# Patient Record
Sex: Female | Born: 1950 | Race: White | Hispanic: No | Smoking: Never smoker
Health system: Southern US, Community
[De-identification: ages and names within clinical notes are randomized; demographics above are authoritative.]

## PROBLEM LIST (undated history)

## (undated) DIAGNOSIS — I10 Essential (primary) hypertension: Secondary | ICD-10-CM

## (undated) DIAGNOSIS — F32A Depression, unspecified: Secondary | ICD-10-CM

## (undated) DIAGNOSIS — F329 Major depressive disorder, single episode, unspecified: Secondary | ICD-10-CM

## (undated) DIAGNOSIS — Z98811 Dental restoration status: Secondary | ICD-10-CM

## (undated) DIAGNOSIS — N3281 Overactive bladder: Secondary | ICD-10-CM

## (undated) DIAGNOSIS — S83249A Other tear of medial meniscus, current injury, unspecified knee, initial encounter: Secondary | ICD-10-CM

## (undated) HISTORY — PX: ABDOMINAL HYSTERECTOMY: SHX81

## (undated) HISTORY — PX: LAPAROSCOPIC CHOLECYSTECTOMY: SUR755

## (undated) HISTORY — PX: TUBAL LIGATION: SHX77

## (undated) HISTORY — PX: CARPAL TUNNEL RELEASE: SHX101

## (undated) HISTORY — PX: BREAST REDUCTION SURGERY: SHX8

## (undated) HISTORY — DX: Major depressive disorder, single episode, unspecified: F32.9

## (undated) HISTORY — DX: Depression, unspecified: F32.A

---

## 2011-10-06 ENCOUNTER — Other Ambulatory Visit (HOSPITAL_COMMUNITY): Payer: Self-pay | Admitting: Orthopedic Surgery

## 2011-10-06 DIAGNOSIS — S93629A Sprain of tarsometatarsal ligament of unspecified foot, initial encounter: Secondary | ICD-10-CM

## 2011-10-09 ENCOUNTER — Other Ambulatory Visit (HOSPITAL_COMMUNITY): Payer: Self-pay | Admitting: Orthopedic Surgery

## 2011-10-09 DIAGNOSIS — T148XXA Other injury of unspecified body region, initial encounter: Secondary | ICD-10-CM

## 2011-10-12 ENCOUNTER — Inpatient Hospital Stay (HOSPITAL_COMMUNITY): Admission: RE | Admit: 2011-10-12 | Payer: Self-pay | Source: Ambulatory Visit

## 2011-10-19 ENCOUNTER — Ambulatory Visit (HOSPITAL_COMMUNITY)
Admission: RE | Admit: 2011-10-19 | Discharge: 2011-10-19 | Disposition: A | Discharge status: Home / Self Care | Payer: 59 | Source: Ambulatory Visit | Attending: Orthopedic Surgery | Admitting: Orthopedic Surgery

## 2011-10-19 DIAGNOSIS — S93629A Sprain of tarsometatarsal ligament of unspecified foot, initial encounter: Secondary | ICD-10-CM

## 2011-10-19 DIAGNOSIS — M7730 Calcaneal spur, unspecified foot: Secondary | ICD-10-CM | POA: Insufficient documentation

## 2011-10-19 DIAGNOSIS — R609 Edema, unspecified: Secondary | ICD-10-CM | POA: Insufficient documentation

## 2011-10-19 DIAGNOSIS — T148XXA Other injury of unspecified body region, initial encounter: Secondary | ICD-10-CM

## 2011-10-19 DIAGNOSIS — M25579 Pain in unspecified ankle and joints of unspecified foot: Secondary | ICD-10-CM | POA: Insufficient documentation

## 2011-10-19 DIAGNOSIS — M76829 Posterior tibial tendinitis, unspecified leg: Secondary | ICD-10-CM | POA: Insufficient documentation

## 2012-07-04 ENCOUNTER — Other Ambulatory Visit (HOSPITAL_COMMUNITY): Payer: Self-pay | Admitting: Family Medicine

## 2012-07-04 DIAGNOSIS — M25561 Pain in right knee: Secondary | ICD-10-CM

## 2012-07-07 ENCOUNTER — Ambulatory Visit (HOSPITAL_COMMUNITY)
Admission: RE | Admit: 2012-07-07 | Discharge: 2012-07-07 | Disposition: A | Discharge status: Home / Self Care | Payer: 59 | Source: Ambulatory Visit | Attending: Family Medicine | Admitting: Family Medicine

## 2012-07-07 ENCOUNTER — Ambulatory Visit (HOSPITAL_COMMUNITY): Admission: RE | Admit: 2012-07-07 | Payer: 59 | Source: Ambulatory Visit

## 2012-07-07 DIAGNOSIS — M7120 Synovial cyst of popliteal space [Baker], unspecified knee: Secondary | ICD-10-CM | POA: Insufficient documentation

## 2012-07-07 DIAGNOSIS — X58XXXA Exposure to other specified factors, initial encounter: Secondary | ICD-10-CM | POA: Insufficient documentation

## 2012-07-07 DIAGNOSIS — M899 Disorder of bone, unspecified: Secondary | ICD-10-CM | POA: Insufficient documentation

## 2012-07-07 DIAGNOSIS — M25561 Pain in right knee: Secondary | ICD-10-CM

## 2012-07-07 DIAGNOSIS — IMO0002 Reserved for concepts with insufficient information to code with codable children: Secondary | ICD-10-CM | POA: Insufficient documentation

## 2012-07-07 DIAGNOSIS — M25469 Effusion, unspecified knee: Secondary | ICD-10-CM | POA: Insufficient documentation

## 2012-07-19 ENCOUNTER — Other Ambulatory Visit: Payer: Self-pay | Admitting: Orthopaedic Surgery

## 2012-07-23 DIAGNOSIS — S83249A Other tear of medial meniscus, current injury, unspecified knee, initial encounter: Secondary | ICD-10-CM

## 2012-07-23 HISTORY — DX: Other tear of medial meniscus, current injury, unspecified knee, initial encounter: S83.249A

## 2012-07-26 ENCOUNTER — Encounter (HOSPITAL_BASED_OUTPATIENT_CLINIC_OR_DEPARTMENT_OTHER): Payer: Self-pay | Admitting: *Deleted

## 2012-07-26 NOTE — Pre-Procedure Instructions (Signed)
To come for BMET and EKG 

## 2012-07-27 NOTE — H&P (Signed)
Alejandra Hartman is an 62 y.o. female.   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  She is having increasing right knee pain that is very intense.  It has been present for 3 months.  She has tried Celebrex to calm it down.  Her activities and sleep are both limited by knee pain.  She feels that things are getting worse.  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  We have discussed proceeding with a knee arthroscopy to help limit her pain and improve her function.  Past Medical History  Diagnosis Date  . Overactive bladder   . Medial meniscus tear 07/2012    right knee  . Dental crowns present   . Hypertension     under control with med., has been on med. x 10 yr.    Past Surgical History  Procedure Date  . Laparoscopic cholecystectomy   . Carpal tunnel release     bilateral  . Abdominal hysterectomy     complete  . Breast reduction surgery   . Tubal ligation     History reviewed. No pertinent family history. Social History:  reports that she has never smoked. She has never used smokeless tobacco. She reports that she drinks alcohol. She reports that she does not use illicit drugs.  Allergies: No Known Allergies  No prescriptions prior to admission    No results found for this or any previous visit (from the past 48 hour(s)). No results found.  Review of Systems  Constitutional: Negative.   HENT: Negative.   Eyes: Negative.   Respiratory: Negative.   Cardiovascular: Negative.   Gastrointestinal: Negative.   Genitourinary: Negative.   Musculoskeletal: Positive for joint pain.  Skin: Negative.   Neurological: Negative.   Endo/Heme/Allergies: Negative.   Psychiatric/Behavioral: Negative.     Height 5\' 2"  (1.575 m), weight 67.132 kg (148 lb). Physical Exam  Constitutional: She appears well-nourished.  HENT:  Head:  Normocephalic.  Eyes: Pupils are equal, round, and reactive to light.  Neck: Normal range of motion.  Cardiovascular: Normal rate and regular rhythm.   Respiratory: Breath sounds normal.  GI: Bowel sounds are normal.  Musculoskeletal:       Right knee exam: Range of motion 0-1 25.  Trace effusion.  Mild crepitation with motion.  Positive medial McMurray's.  Pain along the medial joint line to direct palpation.  Calf soft and nontender negative Homans.  Neurological: She is alert.  Skin: Skin is warm.  Psychiatric: She has a normal mood and affect.     Assessment/Plan Assessment: Right knee torn medial meniscus Plan: Rylinn would like to proceed with a right knee arthroscopy to take care of the meniscus tear.  We have reviewed the risks of anesthesia, infection and potential DVT related to a knee arthroscopy.  Have also discussed the need for postoperative therapy to maximize results.  Hanni Milford R 07/27/2012, 5:36 PM

## 2012-07-28 ENCOUNTER — Encounter (HOSPITAL_BASED_OUTPATIENT_CLINIC_OR_DEPARTMENT_OTHER)
Admission: RE | Admit: 2012-07-28 | Discharge: 2012-07-28 | Disposition: A | Discharge status: Home / Self Care | Payer: 59 | Source: Ambulatory Visit | Attending: Orthopaedic Surgery | Admitting: Orthopaedic Surgery

## 2012-07-28 LAB — BASIC METABOLIC PANEL
BUN: 19 mg/dL (ref 6–23)
CO2: 24 mEq/L (ref 19–32)
Chloride: 105 mEq/L (ref 96–112)
Creatinine, Ser: 0.85 mg/dL (ref 0.50–1.10)

## 2012-08-02 ENCOUNTER — Encounter (HOSPITAL_BASED_OUTPATIENT_CLINIC_OR_DEPARTMENT_OTHER): Payer: Self-pay | Admitting: Anesthesiology

## 2012-08-02 ENCOUNTER — Ambulatory Visit (HOSPITAL_BASED_OUTPATIENT_CLINIC_OR_DEPARTMENT_OTHER): Payer: 59 | Admitting: Anesthesiology

## 2012-08-02 ENCOUNTER — Encounter (HOSPITAL_BASED_OUTPATIENT_CLINIC_OR_DEPARTMENT_OTHER)
Admission: RE | Disposition: A | Discharge status: Home / Self Care | Payer: Self-pay | Source: Ambulatory Visit | Attending: Orthopaedic Surgery

## 2012-08-02 ENCOUNTER — Ambulatory Visit (HOSPITAL_BASED_OUTPATIENT_CLINIC_OR_DEPARTMENT_OTHER)
Admission: RE | Admit: 2012-08-02 | Discharge: 2012-08-02 | Disposition: A | Discharge status: Home / Self Care | Payer: 59 | Source: Ambulatory Visit | Attending: Orthopaedic Surgery | Admitting: Orthopaedic Surgery

## 2012-08-02 DIAGNOSIS — M1710 Unilateral primary osteoarthritis, unspecified knee: Secondary | ICD-10-CM | POA: Insufficient documentation

## 2012-08-02 DIAGNOSIS — M23329 Other meniscus derangements, posterior horn of medial meniscus, unspecified knee: Secondary | ICD-10-CM | POA: Insufficient documentation

## 2012-08-02 DIAGNOSIS — I10 Essential (primary) hypertension: Secondary | ICD-10-CM | POA: Insufficient documentation

## 2012-08-02 DIAGNOSIS — M2240 Chondromalacia patellae, unspecified knee: Secondary | ICD-10-CM | POA: Insufficient documentation

## 2012-08-02 DIAGNOSIS — Z01812 Encounter for preprocedural laboratory examination: Secondary | ICD-10-CM | POA: Insufficient documentation

## 2012-08-02 DIAGNOSIS — S83249A Other tear of medial meniscus, current injury, unspecified knee, initial encounter: Secondary | ICD-10-CM

## 2012-08-02 HISTORY — DX: Dental restoration status: Z98.811

## 2012-08-02 HISTORY — DX: Other tear of medial meniscus, current injury, unspecified knee, initial encounter: S83.249A

## 2012-08-02 HISTORY — DX: Essential (primary) hypertension: I10

## 2012-08-02 HISTORY — PX: KNEE ARTHROSCOPY: SHX127

## 2012-08-02 HISTORY — DX: Overactive bladder: N32.81

## 2012-08-02 SURGERY — ARTHROSCOPY, KNEE
Anesthesia: General | Site: Knee | Laterality: Right | Wound class: Clean

## 2012-08-02 MED ORDER — LIDOCAINE HCL (CARDIAC) 20 MG/ML IV SOLN
INTRAVENOUS | Status: DC | PRN
  Administered 2012-08-02: 50 mg via INTRAVENOUS

## 2012-08-02 MED ORDER — MIDAZOLAM HCL 2 MG/ML PO SYRP: 12.0000 mg | ORAL_SOLUTION | Freq: Once | ORAL | Status: DC | PRN

## 2012-08-02 MED ORDER — LACTATED RINGERS IV SOLN
INTRAVENOUS | Status: DC
Start: 2012-08-02 — End: 2012-08-02
  Administered 2012-08-02: 08:00:00 via INTRAVENOUS

## 2012-08-02 MED ORDER — BUPIVACAINE HCL (PF) 0.25 % IJ SOLN
INTRAMUSCULAR | Status: DC | PRN
  Administered 2012-08-02: 4 mL
  Administered 2012-08-02: 19 mL

## 2012-08-02 MED ORDER — DEXAMETHASONE SODIUM PHOSPHATE 4 MG/ML IJ SOLN
INTRAMUSCULAR | Status: DC | PRN
  Administered 2012-08-02: 10 mg via INTRAVENOUS

## 2012-08-02 MED ORDER — HYDROCODONE-ACETAMINOPHEN 5-325 MG PO TABS: 1.0000 | ORAL_TABLET | Freq: Four times a day (QID) | ORAL | Status: DC | PRN

## 2012-08-02 MED ORDER — FENTANYL CITRATE 0.05 MG/ML IJ SOLN: 50.0000 ug | INTRAMUSCULAR | Status: DC | PRN

## 2012-08-02 MED ORDER — FENTANYL CITRATE 0.05 MG/ML IJ SOLN
INTRAMUSCULAR | Status: DC | PRN
  Administered 2012-08-02: 100 ug via INTRAVENOUS

## 2012-08-02 MED ORDER — MIDAZOLAM HCL 2 MG/2ML IJ SOLN: 1.0000 mg | INTRAMUSCULAR | Status: DC | PRN

## 2012-08-02 MED ORDER — MORPHINE SULFATE 4 MG/ML IJ SOLN
INTRAMUSCULAR | Status: DC | PRN
  Administered 2012-08-02: 4 mg via INTRAVENOUS

## 2012-08-02 MED ORDER — CEFAZOLIN SODIUM-DEXTROSE 2-3 GM-% IV SOLR
2.0000 g | INTRAVENOUS | Status: AC
  Administered 2012-08-02: 2 g via INTRAVENOUS

## 2012-08-02 MED ORDER — CHLORHEXIDINE GLUCONATE 4 % EX LIQD
60.0000 mL | Freq: Once | CUTANEOUS | Status: DC
Start: 2012-08-02 — End: 2012-08-02

## 2012-08-02 MED ORDER — PROPOFOL 10 MG/ML IV BOLUS
INTRAVENOUS | Status: DC | PRN
  Administered 2012-08-02: 200 mg via INTRAVENOUS

## 2012-08-02 MED ORDER — SODIUM CHLORIDE 0.9 % IR SOLN
Status: DC | PRN
  Administered 2012-08-02: 6

## 2012-08-02 MED ORDER — LACTATED RINGERS IV SOLN
INTRAVENOUS | Status: DC
  Administered 2012-08-02: 10:00:00 via INTRAVENOUS

## 2012-08-02 MED ORDER — OXYCODONE HCL 5 MG PO TABS
5.0000 mg | ORAL_TABLET | Freq: Once | ORAL | Status: AC | PRN
  Administered 2012-08-02: 5 mg via ORAL

## 2012-08-02 MED ORDER — HYDROMORPHONE HCL PF 1 MG/ML IJ SOLN
0.2500 mg | INTRAMUSCULAR | Status: DC | PRN
  Administered 2012-08-02 (×2): 0.5 mg via INTRAVENOUS

## 2012-08-02 MED ORDER — METHYLPREDNISOLONE ACETATE 40 MG/ML IJ SUSP
INTRAMUSCULAR | Status: DC | PRN
  Administered 2012-08-02 (×2): 40 mg via INTRA_ARTICULAR

## 2012-08-02 MED ORDER — ONDANSETRON HCL 4 MG/2ML IJ SOLN
INTRAMUSCULAR | Status: DC | PRN
  Administered 2012-08-02: 4 mg via INTRAVENOUS

## 2012-08-02 MED ORDER — MIDAZOLAM HCL 5 MG/5ML IJ SOLN
INTRAMUSCULAR | Status: DC | PRN
  Administered 2012-08-02: 2 mg via INTRAVENOUS

## 2012-08-02 MED ORDER — OXYCODONE HCL 5 MG/5ML PO SOLN: 5.0000 mg | Freq: Once | ORAL | Status: AC | PRN

## 2012-08-02 SURGICAL SUPPLY — 40 items
BANDAGE ELASTIC 6 VELCRO ST LF (GAUZE/BANDAGES/DRESSINGS) ×2 IMPLANT
BANDAGE GAUZE ELAST BULKY 4 IN (GAUZE/BANDAGES/DRESSINGS) ×2 IMPLANT
BLADE CUDA 5.5 (BLADE) IMPLANT
BLADE GREAT WHITE 4.2 (BLADE) ×2 IMPLANT
CANISTER OMNI JUG 16 LITER (MISCELLANEOUS) IMPLANT
CANISTER SUCTION 2500CC (MISCELLANEOUS) IMPLANT
DRAPE ARTHROSCOPY W/POUCH 114 (DRAPES) ×2 IMPLANT
DRAPE U-SHAPE 47X51 STRL (DRAPES) ×2 IMPLANT
DRSG EMULSION OIL 3X3 NADH (GAUZE/BANDAGES/DRESSINGS) ×2 IMPLANT
DURAPREP 26ML APPLICATOR (WOUND CARE) ×2 IMPLANT
ELECT MENISCUS 165MM 90D (ELECTRODE) IMPLANT
ELECT REM PT RETURN 9FT ADLT (ELECTROSURGICAL)
ELECTRODE REM PT RTRN 9FT ADLT (ELECTROSURGICAL) IMPLANT
GLOVE BIO SURGEON STRL SZ 6.5 (GLOVE) ×2 IMPLANT
GLOVE BIO SURGEON STRL SZ8.5 (GLOVE) ×2 IMPLANT
GLOVE BIOGEL PI IND STRL 8 (GLOVE) ×1 IMPLANT
GLOVE BIOGEL PI IND STRL 8.5 (GLOVE) ×1 IMPLANT
GLOVE BIOGEL PI INDICATOR 8 (GLOVE) ×1
GLOVE BIOGEL PI INDICATOR 8.5 (GLOVE) ×1
GLOVE ECLIPSE 6.5 STRL STRAW (GLOVE) ×2 IMPLANT
GLOVE INDICATOR 7.0 STRL GRN (GLOVE) ×4 IMPLANT
GLOVE SS BIOGEL STRL SZ 8 (GLOVE) ×1 IMPLANT
GLOVE SUPERSENSE BIOGEL SZ 8 (GLOVE) ×1
GOWN PREVENTION PLUS XLARGE (GOWN DISPOSABLE) ×6 IMPLANT
GOWN PREVENTION PLUS XXLARGE (GOWN DISPOSABLE) ×2 IMPLANT
IV NS IRRIG 3000ML ARTHROMATIC (IV SOLUTION) IMPLANT
KNEE WRAP E Z 3 GEL PACK (MISCELLANEOUS) ×2 IMPLANT
PACK ARTHROSCOPY DSU (CUSTOM PROCEDURE TRAY) ×2 IMPLANT
PACK BASIN DAY SURGERY FS (CUSTOM PROCEDURE TRAY) ×2 IMPLANT
PENCIL BUTTON HOLSTER BLD 10FT (ELECTRODE) IMPLANT
SET ARTHROSCOPY TUBING (MISCELLANEOUS) ×1
SET ARTHROSCOPY TUBING LN (MISCELLANEOUS) ×1 IMPLANT
SHEET MEDIUM DRAPE 40X70 STRL (DRAPES) ×2 IMPLANT
SPONGE GAUZE 4X4 12PLY (GAUZE/BANDAGES/DRESSINGS) ×2 IMPLANT
SYR 3ML 18GX1 1/2 (SYRINGE) IMPLANT
TOWEL OR 17X24 6PK STRL BLUE (TOWEL DISPOSABLE) ×2 IMPLANT
TOWEL OR NON WOVEN STRL DISP B (DISPOSABLE) ×2 IMPLANT
WAND 30 DEG SABER W/CORD (SURGICAL WAND) IMPLANT
WAND STAR VAC 90 (SURGICAL WAND) IMPLANT
WATER STERILE IRR 1000ML POUR (IV SOLUTION) ×2 IMPLANT

## 2012-08-02 NOTE — Interval H&P Note (Signed)
History and Physical Interval Note:  08/02/2012 8:02 AM  Alejandra Hartman  has presented today for surgery, with the diagnosis of right medial meniscal tear  The various methods of treatment have been discussed with the patient and family. After consideration of risks, benefits and other options for treatment, the patient has consented to  Procedure(s): ARTHROSCOPY KNEE (Right) as a surgical intervention .  The patient's history has been reviewed, patient examined, no change in status, stable for surgery.  I have reviewed the patient's chart and labs.  Questions were answered to the patient's satisfaction.     [name]

## 2012-08-02 NOTE — Anesthesia Preprocedure Evaluation (Signed)
Anesthesia Evaluation  Patient identified by MRN, date of birth, ID band Patient awake    Reviewed: Allergy & Precautions, H&P , NPO status , Patient's Chart, lab work & pertinent test results, reviewed documented beta blocker date and time   Airway Mallampati: II TM Distance: >3 FB Neck ROM: Full    Dental no notable dental hx. (+) Teeth Intact and Dental Advisory Given   Pulmonary neg pulmonary ROS,  breath sounds clear to auscultation  Pulmonary exam normal       Cardiovascular hypertension, On Medications and On Home Beta Blockers Rhythm:Regular Rate:Normal     Neuro/Psych negative neurological ROS  negative psych ROS   GI/Hepatic negative GI ROS, Neg liver ROS,   Endo/Other  negative endocrine ROS  Renal/GU negative Renal ROS  negative genitourinary   Musculoskeletal   Abdominal   Peds  Hematology negative hematology ROS (+)   Anesthesia Other Findings   Reproductive/Obstetrics negative OB ROS                           Anesthesia Physical Anesthesia Plan  ASA: II  Anesthesia Plan: General   Post-op Pain Management:    Induction: Intravenous  Airway Management Planned: LMA  Additional Equipment:   Intra-op Plan:   Post-operative Plan: Extubation in OR  Informed Consent: I have reviewed the patients History and Physical, chart, labs and discussed the procedure including the risks, benefits and alternatives for the proposed anesthesia with the patient or authorized representative who has indicated his/her understanding and acceptance.   Dental advisory given  Plan Discussed with: CRNA  Anesthesia Plan Comments:         Anesthesia Quick Evaluation

## 2012-08-02 NOTE — Transfer of Care (Signed)
Immediate Anesthesia Transfer of Care Note  Patient: Alejandra Hartman  Procedure(s) Performed: Procedure(s) with comments: ARTHROSCOPY KNEE (Right) - medial menisectomy and chonroplasty  Patient Location: PACU  Anesthesia Type:General  Level of Consciousness: sedated  Airway & Oxygen Therapy: Patient Spontanous Breathing and Patient connected to face mask oxygen  Post-op Assessment: Report given to PACU RN and Post -op Vital signs reviewed and stable  Post vital signs: Reviewed and stable  Complications: No apparent anesthesia complications

## 2012-08-02 NOTE — Op Note (Signed)
NAMECARMENCITA, Hartman NO.:  [id number]  MEDICAL RECORD NO.:  [id number]  LOCATION:  MRI                          FACILITY:  [facility]  PHYSICIAN:  Lubertha Basque. Kaio Kuhlman, M.D.DATE OF BIRTH:  September 29, 1950  DATE OF PROCEDURE:  08/02/2012 DATE OF DISCHARGE:  07/07/2012                              OPERATIVE REPORT   PREOPERATIVE DIAGNOSES: 1. Right knee torn medial meniscus. 2. Right knee chondromalacia.  POSTOPERATIVE DIAGNOSES: 1. Right knee torn medial meniscus. 2. Right knee degenerative joint disease. 3. Right knee chondromalacia.  PROCEDURES: 1. Right knee arthroscopic partial medial meniscectomy. 2. Right knee arthroscopic abrasion chondroplasty. 3. Left knee intra-articular injection.  ANESTHESIA:  General.  ATTENDING SURGEON:  Lubertha Basque. Jerl Santos, MD  ASSISTANT:  Lindwood Qua, PA  INDICATION FOR PROCEDURE:  The patient is a 62 year old woman with a long history of right and left knee pain issues.  The right side has been particularly painful with twisting and squatting maneuvers.  She works as an Producer, television/film/video and this has become a bit problematic on the job.  By MRI scan, she has a radial tear of the posterior horn of the medial meniscus with a paucity of degenerative issues.  She is offered an arthroscopy of this knee.  She would also like to have an injection in the opposite knee.  Informed operative consent was obtained after discussion of possible complications including reaction to anesthesia and infection.  SUMMARY OF FINDINGS AND PROCEDURE:  Under general anesthesia, we performed arthroscopy of the right knee.  The suprapatellar pouch was benign while the patellofemoral joint had focal breakdown at the apex of the patella addressed with chondroplasty and abrasion to bleeding bone in 1 tiny area.  In the medial compartment, she had a quarter-sized area of bare bone with some large flaps of articular cartilage on the femur. I performed a  thorough chondroplasty back to stable tissues.  She did have the radial tear of the posterior horn of the medial meniscus consistent with her scan and I performed about a 10% partial medial meniscectomy.  The ACL was normal, and the lateral compartment was benign.  She was injected with the usual agents plus Depo-Medrol.  We also injected the left knee after sterile prep with Depo-Medrol and placed a Band-Aid there.  She was discharged home on same day.  DESCRIPTION OF PROCEDURE:  The patient was taken to the operating suite where general anesthetic was applied without difficulty.  She was positioned supine and prepped and draped in normal sterile fashion. After administration of IV Kefzol, an appropriate time out, an arthroscopy of the right knee was performed through total of 2 portals. Findings were as noted above and the procedure consisted of the abrasion chondroplasty and chondroplasty followed by the partial medial meniscectomy done with basket and shaver involving just the posterior horn of the medial meniscus.  The knee was thoroughly irrigated followed by placement of Marcaine with epinephrine and Depo-Medrol.  Adaptic was placed over the portals followed by dry gauze and loose Ace wrap.  We then performed a sterile prep on the opposite knee and injected with 40 mg of Depo-Medrol and some Marcaine.  A Band-Aid  was applied there. Estimated blood loss and intraoperative fluids obtained from anesthesia records.  DISPOSITION:  The patient was extubated in the operating room and taken to recovery room in stable condition.  She was to go home the same-day and follow up in the office in less than a week.  I will contact her by phone tonight.     Lubertha Basque Jerl Santos, M.D.     PGD/MEDQ  D:  08/02/2012  T:  08/02/2012  Job:  [id number]

## 2012-08-02 NOTE — Op Note (Signed)
#  [id number] 

## 2012-08-02 NOTE — Anesthesia Postprocedure Evaluation (Signed)
  Anesthesia Post-op Note  Patient: Alejandra Hartman  Procedure(s) Performed: Procedure(s) with comments: ARTHROSCOPY KNEE (Right) - medial menisectomy and chonroplasty  Patient Location: PACU  Anesthesia Type:General  Level of Consciousness: awake and alert   Airway and Oxygen Therapy: Patient Spontanous Breathing  Post-op Pain: mild  Post-op Assessment: Post-op Vital signs reviewed, Patient's Cardiovascular Status Stable, Respiratory Function Stable, Patent Airway and No signs of Nausea or vomiting  Post-op Vital Signs: Reviewed and stable  Complications: No apparent anesthesia complications

## 2012-08-02 NOTE — Anesthesia Procedure Notes (Signed)
Procedure Name: LMA Insertion Date/Time: 08/02/2012 8:17 AM Performed by: Caren Macadam Pre-anesthesia Checklist: Patient identified, Emergency Drugs available, Suction available and Patient being monitored Patient Re-evaluated:Patient Re-evaluated prior to inductionOxygen Delivery Method: Circle System Utilized Preoxygenation: Pre-oxygenation with 100% oxygen Intubation Type: IV induction Ventilation: Mask ventilation without difficulty LMA: LMA inserted LMA Size: 4.0 Number of attempts: 1 Airway Equipment and Method: bite block Placement Confirmation: positive ETCO2 and breath sounds checked- equal and bilateral Tube secured with: Tape Dental Injury: Teeth and Oropharynx as per pre-operative assessment

## 2012-08-03 ENCOUNTER — Encounter (HOSPITAL_BASED_OUTPATIENT_CLINIC_OR_DEPARTMENT_OTHER): Payer: Self-pay | Admitting: Orthopaedic Surgery

## 2013-02-01 ENCOUNTER — Other Ambulatory Visit: Payer: Self-pay | Admitting: Family Medicine

## 2013-02-01 DIAGNOSIS — Z1231 Encounter for screening mammogram for malignant neoplasm of breast: Secondary | ICD-10-CM

## 2013-03-09 ENCOUNTER — Ambulatory Visit
Admission: RE | Admit: 2013-03-09 | Discharge: 2013-03-09 | Disposition: A | Discharge status: Home / Self Care | Payer: 59 | Source: Ambulatory Visit | Attending: Family Medicine | Admitting: Family Medicine

## 2013-03-09 ENCOUNTER — Ambulatory Visit: Payer: 59

## 2013-03-09 DIAGNOSIS — Z1231 Encounter for screening mammogram for malignant neoplasm of breast: Secondary | ICD-10-CM

## 2013-04-28 ENCOUNTER — Ambulatory Visit (INDEPENDENT_AMBULATORY_CARE_PROVIDER_SITE_OTHER): Payer: 59

## 2013-04-28 VITALS — BP 138/82 | HR 68 | Resp 18

## 2013-04-28 DIAGNOSIS — M79609 Pain in unspecified limb: Secondary | ICD-10-CM

## 2013-04-28 DIAGNOSIS — G5761 Lesion of plantar nerve, right lower limb: Secondary | ICD-10-CM

## 2013-04-28 DIAGNOSIS — G5760 Lesion of plantar nerve, unspecified lower limb: Secondary | ICD-10-CM

## 2013-04-28 MED ORDER — HYDROCODONE-ACETAMINOPHEN 5-325 MG PO TABS: 1.0000 | ORAL_TABLET | Freq: Four times a day (QID) | ORAL | Status: DC | PRN

## 2013-04-28 NOTE — Progress Notes (Signed)
  Subjective:    Patient ID: Alejandra Hartman, female    DOB: 08-May-1951, 62 y.o.   MRN: [id number]  HPI my toes are on my right foot are hurting and was in here 7 weeks ago and pain ful last 2 weeks and worse this time before i came to him Proxy 7 weeks the patient is given a steroid injection which gave several weeks of relief now the pain has returned in the second interspace second toe area with paresthesia in shooting sensation.   Review of Systems  Constitutional: Negative.   HENT: Negative.   Eyes: Negative.   Respiratory: Negative.   Cardiovascular: Negative.   Gastrointestinal: Negative.   Endocrine: Negative.   Genitourinary: Negative.   Musculoskeletal: Positive for back pain.  Skin: Negative.   Allergic/Immunologic: Negative.   Neurological: Negative.   Hematological: Negative.   Psychiatric/Behavioral: Negative.        Objective:   Physical Exam Neurovascular status is intact pulses palpable epicritic and proprioceptive sensations intact with exacerbated hyperesthesia second interspace second toe area right foot. Cannot rule out some slight swelling or enlargement or capsulitis second MTP area as well as neuroma symptomology. There is some slight splaying of the second and third toes on AP examination of the foot. 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 Plan at this time discussed options my recommendation is for series of alcohol sclerosing injections patient was amendable to this as alternative to surgical intervention. At this time the first a series of 57 alcohol injections is delivered a total of 1 cc of 4% dehydrated alcohol solution and 0.5% Marcaine plain is delivered to the second intermetatarsal space of the right foot. The second common digital nerve is infiltrated with slight discomfort consistent with neuroma  symptomology. Patient is instructed to apply ice daily to the affected area also prescription for hydrocodone or Norco is refill that this time. Scheduled series of weekly visits for the next 4 weeks for a total of 5 alcohol sclerosing injections. A 6 the seventh injection series may be utilized after one-month break. Will reassess progress after long-term series of injections. Patient is hoping to avoid surgical intervention and is advised there is likely good success based on previous history of alcohol injections contact us with any changes or exacerbations or difficulties with the injections or medications next  Alvan Dame DPM

## 2013-04-28 NOTE — Patient Instructions (Signed)

## 2013-05-05 ENCOUNTER — Ambulatory Visit (INDEPENDENT_AMBULATORY_CARE_PROVIDER_SITE_OTHER): Payer: 59

## 2013-05-05 VITALS — BP 135/73 | HR 63 | Resp 20

## 2013-05-05 DIAGNOSIS — G5760 Lesion of plantar nerve, unspecified lower limb: Secondary | ICD-10-CM

## 2013-05-05 DIAGNOSIS — M79609 Pain in unspecified limb: Secondary | ICD-10-CM

## 2013-05-05 DIAGNOSIS — G5761 Lesion of plantar nerve, right lower limb: Secondary | ICD-10-CM

## 2013-05-05 NOTE — Patient Instructions (Signed)

## 2013-05-05 NOTE — Progress Notes (Signed)
  Subjective:    Patient ID: Alejandra Hartman, female    DOB: 04/25/1951, 62 y.o.   MRN: [id number] "It's not a lot better."  HPI no changes in medications or history at this time    Review of Systems unchanged     Objective:   Physical Exam Neurovascular status intact and unchanged pedal pulses and epicritic and proprioceptive sensations unchanged. Patient presents for second alcohol sclerosing injection at this time second interspace right foot. No bruising or ecchymosis noted still painful on compression second interspace direct lateral compression.       Assessment & Plan:  Morton's neuroma second interspace right foot. Status post first alcohol injection no significant complaint. At this time a second injection 1 cc 4% alcohol and 0.5% Marcaine plain is infiltrated to the second common digital nerve second intermetatarsal space right foot. Patient how the procedure well was given instructions to apply ice as instructed keep appointment in one week for scheduled third injection of the series of 5-7 injections. Patient indicated the injection today was less painful than the initial injection.  Alvan Dame DPM

## 2013-05-10 ENCOUNTER — Ambulatory Visit (INDEPENDENT_AMBULATORY_CARE_PROVIDER_SITE_OTHER): Payer: 59

## 2013-05-10 VITALS — BP 128/75 | HR 59 | Resp 16

## 2013-05-10 DIAGNOSIS — G5761 Lesion of plantar nerve, right lower limb: Secondary | ICD-10-CM

## 2013-05-10 DIAGNOSIS — G5760 Lesion of plantar nerve, unspecified lower limb: Secondary | ICD-10-CM

## 2013-05-10 NOTE — Patient Instructions (Signed)

## 2013-05-10 NOTE — Progress Notes (Signed)
  Subjective:    Patient ID: Alejandra Hartman, female    DOB: 1951/05/08, 62 y.o.   MRN: [id number]  HPI Comments: "It really doesn't feel any better this time"  2nd interspace right foot      Review of Systems no new changes were finding     Objective:   Physical Exam Neurovascular status intact there is still pain on direct compression second interspace right foot. No ecchymosis notes is excessive edema noted. Still paresthesia with enclosed shoes walking ambulation affecting second and third toe       Assessment & Plan:  Assessment Morton's neuroma second interspace right foot plan at this time a third injection 1 cc of 4% alcohol solution and 0.5% Marcaine plain delivered to the second common digital nerve. Patient tolerated injection well given instructions for ice application and followup appointment in one week for her fourth injection in a series of 5-7 total injections.  Alvan Dame DP

## 2013-05-16 ENCOUNTER — Ambulatory Visit (INDEPENDENT_AMBULATORY_CARE_PROVIDER_SITE_OTHER): Payer: 59

## 2013-05-16 VITALS — BP 123/70 | HR 67 | Resp 20

## 2013-05-16 DIAGNOSIS — G5761 Lesion of plantar nerve, right lower limb: Secondary | ICD-10-CM

## 2013-05-16 DIAGNOSIS — M79609 Pain in unspecified limb: Secondary | ICD-10-CM

## 2013-05-16 DIAGNOSIS — G5760 Lesion of plantar nerve, unspecified lower limb: Secondary | ICD-10-CM

## 2013-05-16 NOTE — Progress Notes (Signed)
  Subjective:    Patient ID: Alejandra Hartman, female    DOB: Oct 13, 1950, 62 y.o.   MRN: [id number]  HPI patient presents this time for followup of neuroma second interspace right foot has had 3 injections thus far still feels pain second interspace with shooting sensation    Review of Systems no changes     Objective:   Physical Exam Neurovascular status intact pedal pulses palpable epicritic and proprioceptive sensations intact direct pain on compression second interspace right foot consistent with positive Mulder sign and Morton's neuroma right foot.       Assessment & Plan:  Morton's neuroma right foot having had 3 alcohol sclerosing injections at this time the fourth injection delivered 1 cc of 4% dehydrated alcohol and 0.5% Marcaine plain to the second common digital nerve right foot. Patient tolerated the injection well we'll followup with ice application daily to maintain accommodative shoes avoid any compression or ballistic activities. Recheck in one week plan for a fifth injection and then a likely one-month break before sixth or seventh injection is considered.  Alvan Dame DPM

## 2013-05-16 NOTE — Progress Notes (Signed)
  Subjective:    Patient ID: Alejandra Hartman, female    DOB: 03-27-1951, 62 y.o.   MRN: [id number]  "It ain't changed a whole lot.  It just burns all the time."  HPI    Review of Systems     Objective:   Physical Exam        Assessment & Plan:

## 2013-05-16 NOTE — Patient Instructions (Signed)

## 2013-05-24 ENCOUNTER — Ambulatory Visit: Payer: 59

## 2013-05-26 ENCOUNTER — Ambulatory Visit (INDEPENDENT_AMBULATORY_CARE_PROVIDER_SITE_OTHER): Payer: 59

## 2013-05-26 VITALS — BP 125/71 | HR 63 | Resp 18

## 2013-05-26 DIAGNOSIS — G5761 Lesion of plantar nerve, right lower limb: Secondary | ICD-10-CM

## 2013-05-26 DIAGNOSIS — G5760 Lesion of plantar nerve, unspecified lower limb: Secondary | ICD-10-CM

## 2013-05-26 DIAGNOSIS — M79609 Pain in unspecified limb: Secondary | ICD-10-CM

## 2013-05-26 NOTE — Progress Notes (Signed)
   Subjective:    Patient ID: Alejandra Hartman, female    DOB: 1951/05/15, 62 y.o.   MRN: [id number]  HPI my foot is about the same but is better in the morning and gets worse in the afternoon    Review of Systems no changes     Objective:   Physical Exam Neurovascular status is intact pedal pulses palpable. Patient is describing some paresthesias and even to the tips of her toes second and third toes her toenails are getting tender. Patient shoes have adequate space there is no physical sign of ecchymosis in the second interspace. There is pain on direct lateral compression second interspace positive Mulder sign consistent with neuroma symptomology. Cannot rule out ecchymosis and bruising secondary to a series of injections at this time.       Assessment & Plan:  Suspect Morton's neuroma right foot second interspace. Plan at this time a fifth injection of 4% dehydrated alcohol and 0.5% Marcaine was delivered to the second common digital nerve second interspace right foot. Patient tolerated this well continue with ice and Tylenol as needed for pain recheck in 6 weeks for long-term followup. Maintain a coming shoe gear suggested [employer] or often running for shoes  Alvan Dame DPM

## 2013-05-26 NOTE — Patient Instructions (Signed)

## 2013-06-09 ENCOUNTER — Telehealth: Payer: Self-pay | Admitting: *Deleted

## 2013-06-09 MED ORDER — HYDROCODONE-ACETAMINOPHEN 5-325 MG PO TABS: 1.0000 | ORAL_TABLET | Freq: Four times a day (QID) | ORAL | Status: AC | PRN

## 2013-06-09 NOTE — Telephone Encounter (Signed)
informed pt she could pick her rx up in the [place] office.

## 2013-06-09 NOTE — Telephone Encounter (Addendum)
Pt states is in pain, Tramadol and Tylenol doesn't help.  Dr Ralene Cork ordered refill Vicodin as previously ordered.

## 2013-06-09 NOTE — Telephone Encounter (Signed)
I left a message to pick her prescription at the [facility]

## 2013-07-07 ENCOUNTER — Ambulatory Visit: Payer: 59

## 2013-07-12 ENCOUNTER — Ambulatory Visit (INDEPENDENT_AMBULATORY_CARE_PROVIDER_SITE_OTHER): Payer: 59

## 2013-07-12 VITALS — BP 139/76 | HR 68 | Resp 12

## 2013-07-12 DIAGNOSIS — M79609 Pain in unspecified limb: Secondary | ICD-10-CM

## 2013-07-12 DIAGNOSIS — G5760 Lesion of plantar nerve, unspecified lower limb: Secondary | ICD-10-CM

## 2013-07-12 DIAGNOSIS — G5761 Lesion of plantar nerve, right lower limb: Secondary | ICD-10-CM

## 2013-07-12 DIAGNOSIS — M779 Enthesopathy, unspecified: Secondary | ICD-10-CM

## 2013-07-12 MED ORDER — CELECOXIB 200 MG PO CAPS: 200.0000 mg | ORAL_CAPSULE | Freq: Every day | ORAL | Status: AC

## 2013-07-12 NOTE — Patient Instructions (Signed)

## 2013-07-12 NOTE — Progress Notes (Signed)
   Subjective:    Patient ID: Alejandra Hartman, female    DOB: [birth year], 63 y.o.   MRN: [id number]  HPI Comments: '' RT FOOT STILL HURTING.''  Foot Pain      Review of Systems no new changes or find     Objective:   Physical Exam Neurovascular status is intact continues to pain on direct lateral compression second interspace right foot and subsecond MTP area. Has had some improvement with use of the Birkenstock sandal she is wearing as well the series of 5 alcohol injection she is undergone. Continues to have some pain however declined needed additional injections at this time will get some time we'll switch to Celebrex as an NSAID continue with warm compress and ice packs to the area and maintaining Birkenstock type shoes. If she continues to have difficulty may be candidate for additional injections or possibly surgical intervention in the future.       Assessment & Plan:  Assessment recalcitrant neuroma second interspace right foot patient is given a prescription for Celebrex reappointed with the next 2 or 3 months for followup as needed begin conservative care coming shoes warm compress and ice and Celebrex dispensed at this time.  Alvan Dameichard Bryah [place] DPM

## 2013-08-23 ENCOUNTER — Ambulatory Visit: Payer: 59

## 2014-02-12 ENCOUNTER — Other Ambulatory Visit: Payer: Self-pay

## 2014-02-12 ENCOUNTER — Other Ambulatory Visit: Payer: Self-pay | Admitting: Physician Assistant

## 2014-02-12 DIAGNOSIS — Z1231 Encounter for screening mammogram for malignant neoplasm of breast: Secondary | ICD-10-CM

## 2014-03-12 ENCOUNTER — Ambulatory Visit: Payer: 59

## 2014-05-10 ENCOUNTER — Other Ambulatory Visit: Payer: Self-pay | Admitting: Physician Assistant

## 2014-05-10 ENCOUNTER — Ambulatory Visit
Admission: RE | Admit: 2014-05-10 | Discharge: 2014-05-10 | Disposition: A | Discharge status: Home / Self Care | Payer: 59 | Source: Ambulatory Visit | Attending: Physician Assistant | Admitting: Physician Assistant

## 2014-05-10 DIAGNOSIS — N6320 Unspecified lump in the left breast, unspecified quadrant: Principal | ICD-10-CM

## 2014-05-10 DIAGNOSIS — Z1231 Encounter for screening mammogram for malignant neoplasm of breast: Secondary | ICD-10-CM

## 2014-05-10 DIAGNOSIS — N6325 Unspecified lump in the left breast, overlapping quadrants: Secondary | ICD-10-CM

## 2014-05-29 ENCOUNTER — Ambulatory Visit
Admission: RE | Admit: 2014-05-29 | Discharge: 2014-05-29 | Disposition: A | Discharge status: Home / Self Care | Payer: 59 | Source: Ambulatory Visit | Attending: Physician Assistant | Admitting: Physician Assistant

## 2014-05-29 DIAGNOSIS — N6320 Unspecified lump in the left breast, unspecified quadrant: Principal | ICD-10-CM

## 2014-05-29 DIAGNOSIS — N6325 Unspecified lump in the left breast, overlapping quadrants: Secondary | ICD-10-CM

## 2014-11-01 ENCOUNTER — Other Ambulatory Visit: Payer: Self-pay | Admitting: Physician Assistant

## 2014-11-01 DIAGNOSIS — N63 Unspecified lump in unspecified breast: Secondary | ICD-10-CM

## 2014-12-30 ENCOUNTER — Emergency Department (HOSPITAL_BASED_OUTPATIENT_CLINIC_OR_DEPARTMENT_OTHER): Payer: Federal, State, Local not specified - PPO

## 2014-12-30 ENCOUNTER — Encounter (HOSPITAL_BASED_OUTPATIENT_CLINIC_OR_DEPARTMENT_OTHER): Payer: Self-pay | Admitting: Emergency Medicine

## 2014-12-30 ENCOUNTER — Emergency Department (HOSPITAL_BASED_OUTPATIENT_CLINIC_OR_DEPARTMENT_OTHER)
Admission: EM | Admit: 2014-12-30 | Discharge: 2014-12-30 | Disposition: A | Discharge status: Home / Self Care | Payer: Federal, State, Local not specified - PPO | Attending: Emergency Medicine | Admitting: Emergency Medicine

## 2014-12-30 DIAGNOSIS — F329 Major depressive disorder, single episode, unspecified: Secondary | ICD-10-CM | POA: Diagnosis not present

## 2014-12-30 DIAGNOSIS — Y999 Unspecified external cause status: Secondary | ICD-10-CM | POA: Insufficient documentation

## 2014-12-30 DIAGNOSIS — Z87828 Personal history of other (healed) physical injury and trauma: Secondary | ICD-10-CM | POA: Diagnosis not present

## 2014-12-30 DIAGNOSIS — I10 Essential (primary) hypertension: Secondary | ICD-10-CM | POA: Diagnosis not present

## 2014-12-30 DIAGNOSIS — Y939 Activity, unspecified: Secondary | ICD-10-CM | POA: Diagnosis not present

## 2014-12-30 DIAGNOSIS — Z87448 Personal history of other diseases of urinary system: Secondary | ICD-10-CM | POA: Diagnosis not present

## 2014-12-30 DIAGNOSIS — Y929 Unspecified place or not applicable: Secondary | ICD-10-CM | POA: Diagnosis not present

## 2014-12-30 DIAGNOSIS — Z79899 Other long term (current) drug therapy: Secondary | ICD-10-CM | POA: Diagnosis not present

## 2014-12-30 DIAGNOSIS — Z791 Long term (current) use of non-steroidal anti-inflammatories (NSAID): Secondary | ICD-10-CM | POA: Insufficient documentation

## 2014-12-30 DIAGNOSIS — W1830XA Fall on same level, unspecified, initial encounter: Secondary | ICD-10-CM | POA: Diagnosis not present

## 2014-12-30 DIAGNOSIS — R0789 Other chest pain: Secondary | ICD-10-CM

## 2014-12-30 DIAGNOSIS — S299XXA Unspecified injury of thorax, initial encounter: Secondary | ICD-10-CM | POA: Insufficient documentation

## 2014-12-30 MED ORDER — OXYCODONE-ACETAMINOPHEN 5-325 MG PO TABS: 1.0000 | ORAL_TABLET | ORAL | Status: AC | PRN

## 2014-12-30 NOTE — ED Provider Notes (Signed)
CSN: [id number]     Arrival date & time 12/30/14  1441 History  This chart was scribed for Margarita Grizzle, MD by Abel Presto, ED Scribe. This patient was seen in room MH06/MH06 and the patient's care was started at 3:12 PM.    Chief Complaint  Patient presents with  . Chest Pain     The history is provided by the patient. No language interpreter was used.   HPI Comments: Alejandra Hartman is a 64 y.o. female with PMHx of HTN who presents to the Emergency Department complaining of central chest pain with onset 3 days ago. Pt reports a mechanical fall 3 days ago and states she fell flat onto her chest and hit her head. She denies pain radiation. She notes pain worsens with movement. She has tried Tramadol with mild relief. She denies LOC and headache.   Past Medical History  Diagnosis Date  . Overactive bladder   . Medial meniscus tear 07/2012    right knee  . Dental crowns present   . Hypertension     under control with med., has been on med. x 10 yr.  . Depression    Past Surgical History  Procedure Laterality Date  . Laparoscopic cholecystectomy    . Carpal tunnel release      bilateral  . Abdominal hysterectomy      complete  . Breast reduction surgery    . Tubal ligation    . Knee arthroscopy Right 08/02/2012    Procedure: ARTHROSCOPY KNEE;  Surgeon: Velna Ochs, MD;  Location: [facility];  Service: Orthopedics;  Laterality: Right;  medial menisectomy and chonroplasty   Family History  Problem Relation Age of Onset  . Heart disease Mother   . Heart disease Father    History  Substance Use Topics  . Smoking status: Never Smoker   . Smokeless tobacco: Never Used  . Alcohol Use: Yes     Comment: rarely   OB History    No data available     Review of Systems  Musculoskeletal: Positive for myalgias.  Neurological: Negative for syncope and headaches.  All other systems reviewed and are negative.     Allergies  Review of patient's allergies  indicates no known allergies.  Home Medications   Prior to Admission medications   Medication Sig Start Date End Date Taking? Authorizing Provider  ALPRAZolam Prudy Feeler) 0.25 MG tablet Take 0.25 mg by mouth at bedtime as needed for anxiety.    Historical Provider, MD  celecoxib (CELEBREX) 200 MG capsule Take 1 capsule (200 mg total) by mouth daily. 07/12/13   Alvan Dame, DPM  HYDROcodone-acetaminophen (NORCO/VICODIN) 5-325 MG per tablet Take 1 tablet by mouth every 6 (six) hours as needed. 06/09/13   Alvan Dame, DPM  ibuprofen (ADVIL,MOTRIN) 200 MG tablet Take 200 mg by mouth every 6 (six) hours as needed.    Historical Provider, MD  metoprolol succinate (TOPROL-XL) 50 MG 24 hr tablet Take 50 mg by mouth daily. Take with or immediately following a meal.    Historical Provider, MD  sertraline (ZOLOFT) 25 MG tablet Take 25 mg by mouth daily.    Historical Provider, MD   BP 141/85 mmHg  Pulse 52  Temp(Src) 98.6 F (37 C) (Oral)  Resp 15  SpO2 93% Physical Exam  Constitutional: She is oriented to person, place, and time. She appears well-developed and well-nourished.  HENT:  Head: Normocephalic and atraumatic.  Right Ear: External ear normal.  Left Ear: External  ear normal.  Nose: Nose normal.  Mouth/Throat: Oropharynx is clear and moist.  Eyes: Conjunctivae and EOM are normal. Pupils are equal, round, and reactive to light.  Neck: Normal range of motion. Neck supple.  Cardiovascular: Normal rate and regular rhythm.   No murmur heard. Pulmonary/Chest: Effort normal and breath sounds normal. No respiratory distress. She exhibits tenderness.    Abdominal: Soft. Bowel sounds are normal. She exhibits no distension. There is no tenderness. There is no rebound and no guarding.  Musculoskeletal: Normal range of motion. She exhibits no edema or tenderness.  Neurological: She is alert and oriented to person, place, and time. She has normal reflexes. No cranial nerve deficit. Coordination  normal.  Skin: Skin is warm and dry.  Psychiatric: She has a normal mood and affect. Her behavior is normal. Judgment and thought content normal.  Nursing note and vitals reviewed.   ED Course  Procedures (including critical care time) DIAGNOSTIC STUDIES: Oxygen Saturation is 93% on room air, adequate by my interpretation.    COORDINATION OF CARE: 3:16 PM Discussed treatment plan with patient at beside including DG Sternum and Chest, the patient agrees with the plan and has no further questions at this time.   Labs Review Labs Reviewed - No data to display  Imaging Review Dg Chest 2 View  12/30/2014   CLINICAL DATA:  Patient status post fall. Complains of sternal pain.  EXAM: CHEST  2 VIEW  COMPARISON:  None.  FINDINGS: Monitoring leads overlie the patient. Stable cardiac and mediastinal contours. O consolidative pulmonary opacities. No pleural effusion or pneumothorax. Mid thoracic spine degenerative changes.  IMPRESSION: No active cardiopulmonary disease.   Electronically Signed   By: Annia Beltrew  Davis M.D.   On: 12/30/2014 15:43   Dg Sternum  12/30/2014   CLINICAL DATA:  Fall at home 3 days ago. Sternal injury and pain. Initial encounter.  EXAM: STERNUM - 1 VIEW  COMPARISON:  None.  FINDINGS: Lateral view the sternum shows no evidence of fracture or other focal bone lesions.  IMPRESSION: Negative.   Electronically Signed   By: Myles RosenthalJohn  Stahl M.D.   On: 12/30/2014 15:47     EKG Interpretation   Date/Time:  Sunday December 30 2014 15:00:22 EDT Ventricular Rate:  68 PR Interval:  162 QRS Duration: 74 QT Interval:  448 QTC Calculation: 476 R Axis:   52 Text Interpretation:  Normal sinus rhythm Normal ECG Confirmed by Delshawn Stech MD,  Duwayne HeckANIELLE ([id number]) on 12/30/2014 3:10:02 PM      MDM   Final diagnoses:  Chest wall pain   I personally performed the services described in this documentation, which was scribed in my presence. The recorded information has been reviewed and considered.   Margarita Grizzleanielle  Amelya Mabry, MD 12/30/14 [phone]

## 2014-12-30 NOTE — ED Notes (Signed)
Pt in c/o centralized, non-radiating chest pain x 2 days after she had a mechanical fall, denies having any sx at time of the fall. States has been treating pain at home with tramadol with no relief. Pain is reproducible, and pt reports extreme tenderness with chest palpation, and pain improves/worsens with movement and increases on inspiration. Pt is alert, oriented, ambulatory and in NAD. Has 5 direct relative who suddenly passed of acute MI's.

## 2014-12-30 NOTE — Discharge Instructions (Signed)

## 2015-12-09 IMAGING — DX DG STERNUM 2+V
1 series · 1 of 1 positions shown · non-contrast
Comparison: None.

CLINICAL DATA: Fall at home 3 days ago. Sternal injury and pain.
Initial encounter.

EXAM:
STERNUM - 1 VIEW

[sternum lat]
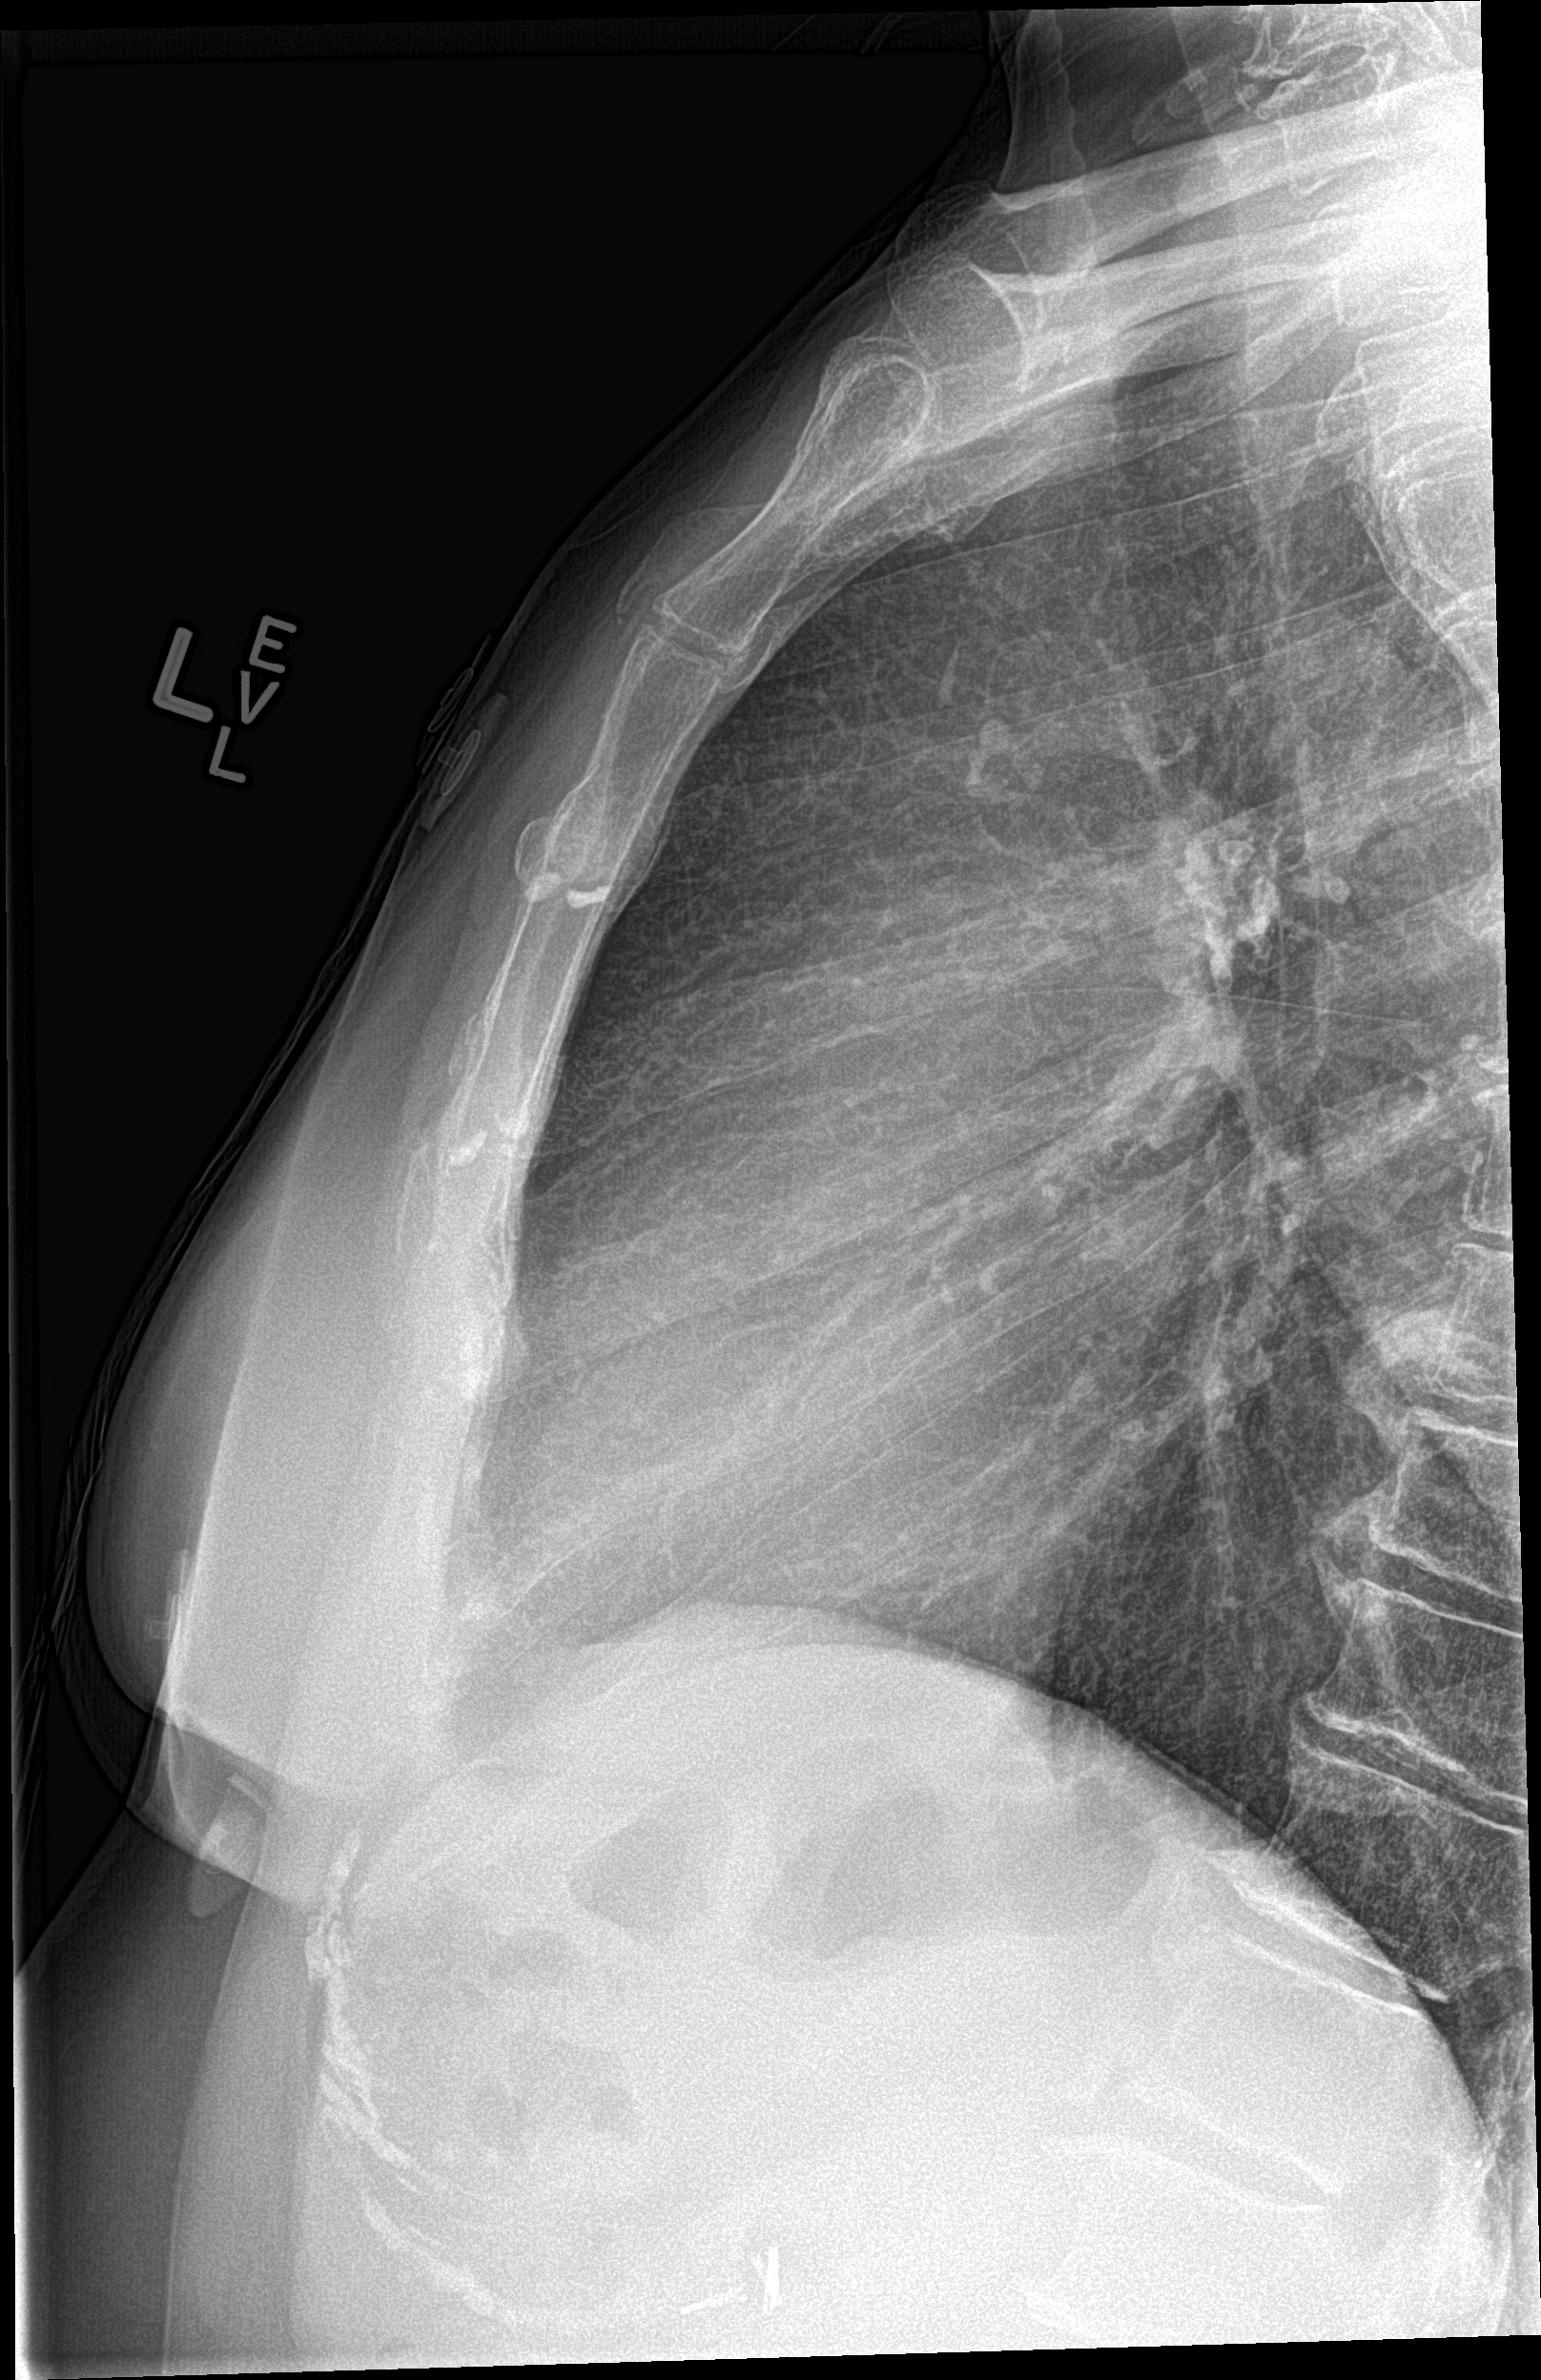

[1 of 1 positions shown; findings below may reference images not displayed]

FINDINGS: Lateral view the sternum shows no evidence of fracture or other
focal bone lesions.
IMPRESSION: Negative.

## 2015-12-09 IMAGING — DX DG CHEST 2V
2 series · 2 of 2 positions shown · non-contrast
Comparison: None.

CLINICAL DATA: Patient status post fall. Complains of sternal pain.

EXAM:
CHEST  2 VIEW

[chest pa]
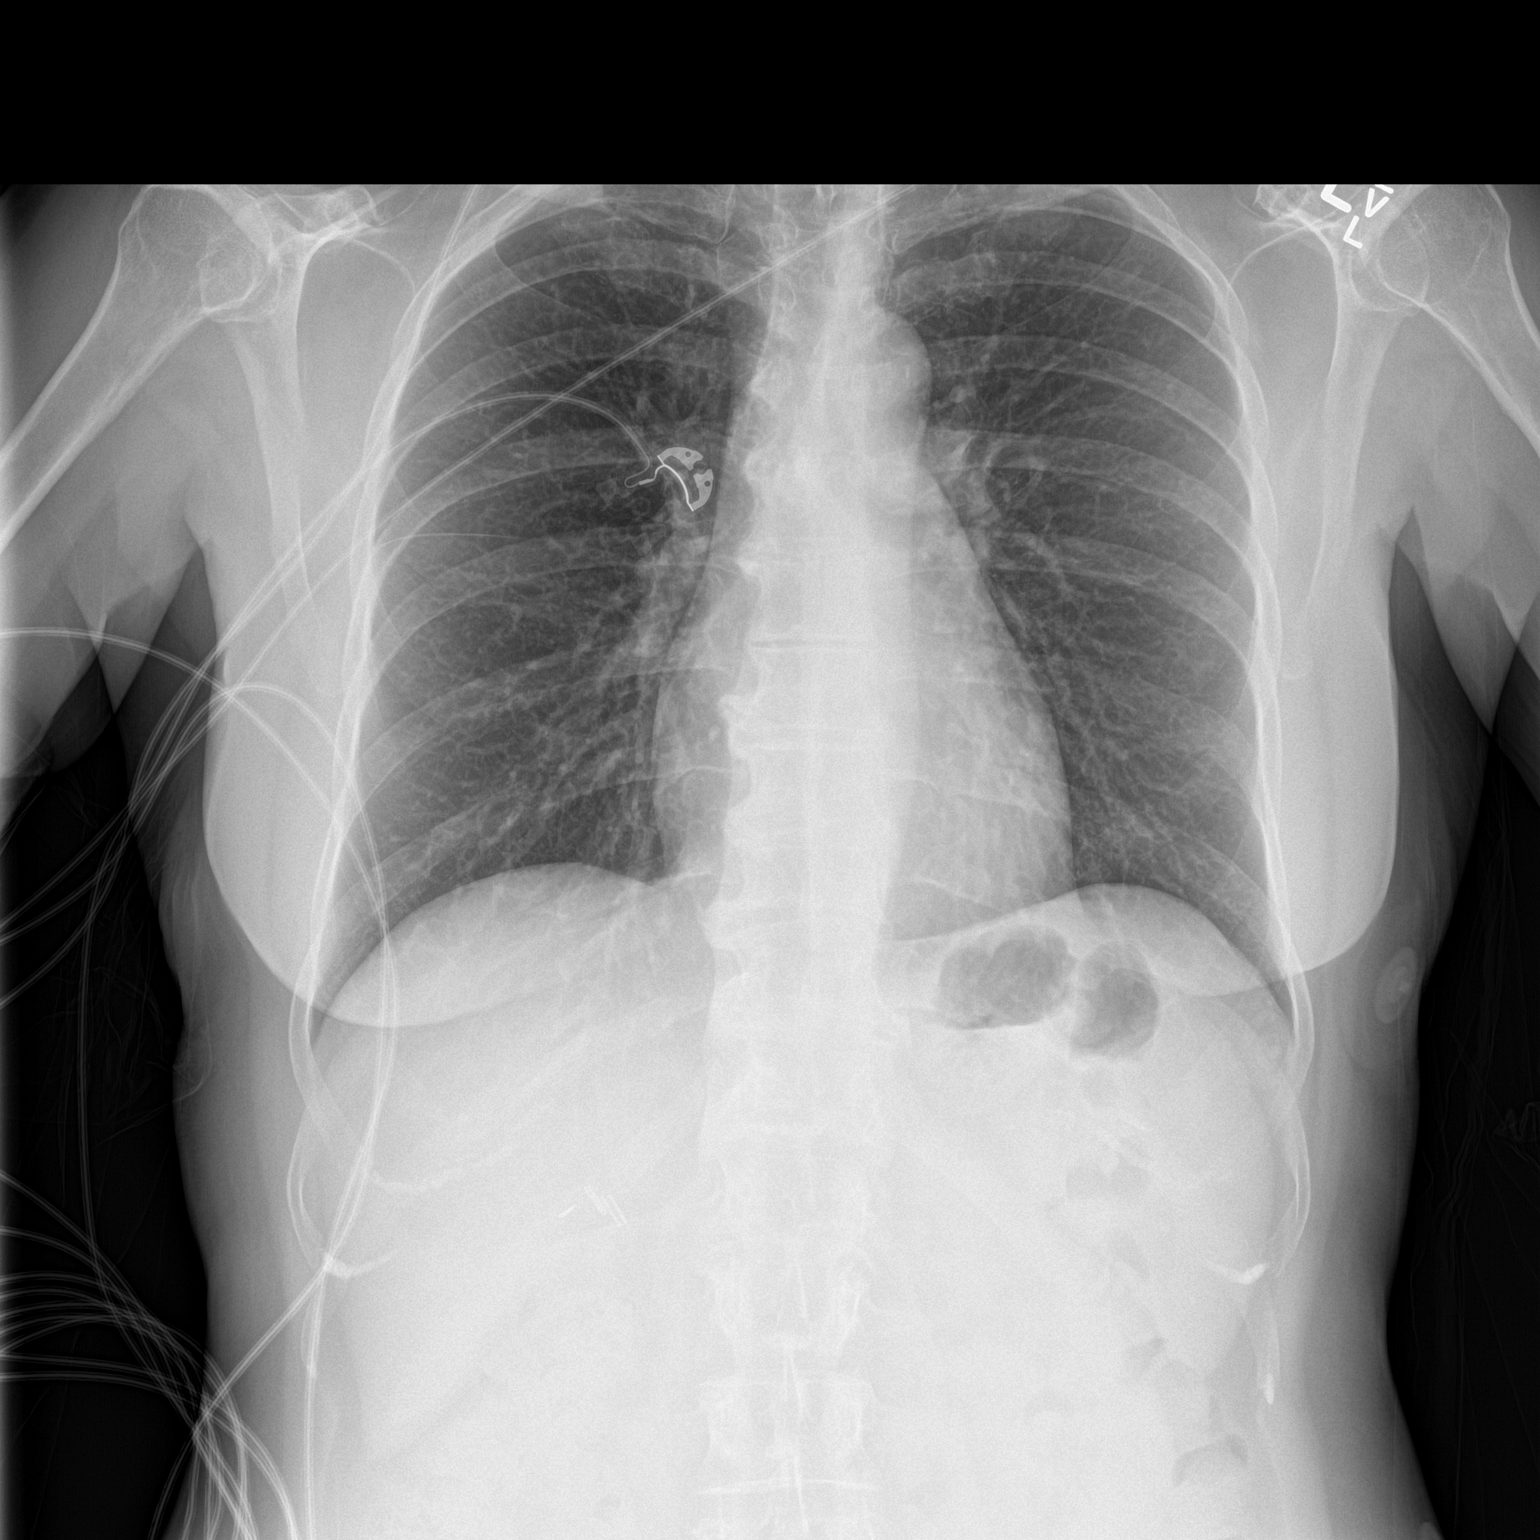

[chest lat]
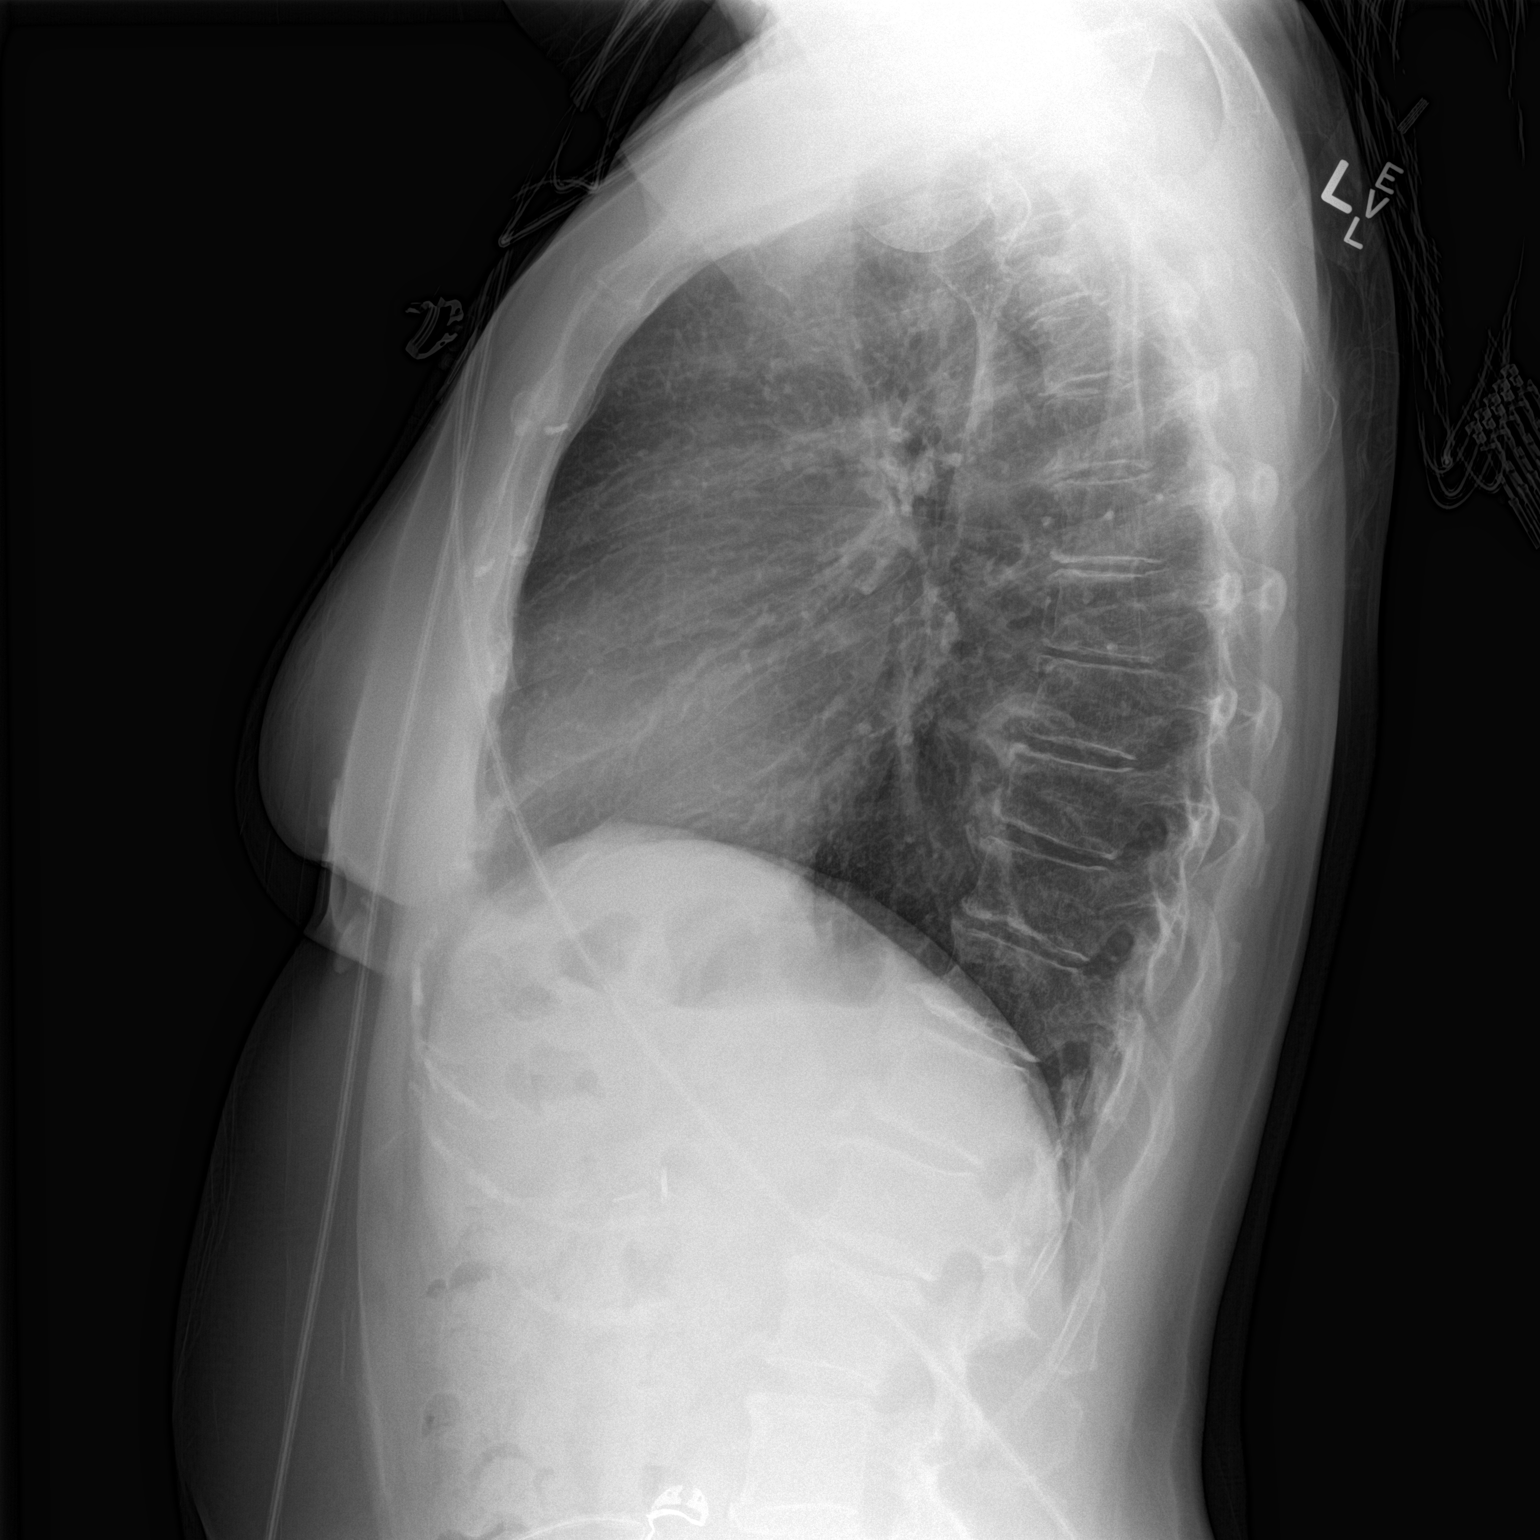

[2 of 2 positions shown; findings below may reference images not displayed]

FINDINGS: Monitoring leads overlie the patient. Stable cardiac and mediastinal
contours. O consolidative pulmonary opacities. No pleural effusion
or pneumothorax. Mid thoracic spine degenerative changes.
IMPRESSION: No active cardiopulmonary disease.
